# Patient Record
Sex: Female | Born: 1945
Health system: Southern US, Community
[De-identification: ages and names within clinical notes are randomized; demographics above are authoritative.]

## PROBLEM LIST (undated history)

## (undated) DIAGNOSIS — E041 Nontoxic single thyroid nodule: Secondary | ICD-10-CM

## (undated) DIAGNOSIS — E119 Type 2 diabetes mellitus without complications: Secondary | ICD-10-CM

## (undated) DIAGNOSIS — M1990 Unspecified osteoarthritis, unspecified site: Secondary | ICD-10-CM

## (undated) DIAGNOSIS — D649 Anemia, unspecified: Secondary | ICD-10-CM

## (undated) DIAGNOSIS — H269 Unspecified cataract: Secondary | ICD-10-CM

## (undated) DIAGNOSIS — I10 Essential (primary) hypertension: Secondary | ICD-10-CM

## (undated) HISTORY — DX: Essential (primary) hypertension: I10

## (undated) HISTORY — DX: Unspecified cataract: H26.9

## (undated) HISTORY — DX: Nontoxic single thyroid nodule: E04.1

## (undated) HISTORY — DX: Type 2 diabetes mellitus without complications: E11.9

## (undated) HISTORY — DX: Anemia, unspecified: D64.9

## (undated) HISTORY — DX: Unspecified osteoarthritis, unspecified site: M19.90

---

## 1998-10-28 ENCOUNTER — Encounter: Payer: Self-pay | Admitting: Family Medicine

## 1998-10-28 ENCOUNTER — Ambulatory Visit (HOSPITAL_COMMUNITY): Admission: RE | Admit: 1998-10-28 | Discharge: 1998-10-28 | Payer: Self-pay | Admitting: Family Medicine

## 1999-12-10 ENCOUNTER — Encounter: Payer: Self-pay | Admitting: Endocrinology

## 1999-12-10 ENCOUNTER — Ambulatory Visit (HOSPITAL_COMMUNITY): Admission: RE | Admit: 1999-12-10 | Discharge: 1999-12-10 | Payer: Self-pay | Admitting: Endocrinology

## 2000-01-05 ENCOUNTER — Encounter: Payer: Self-pay | Admitting: Endocrinology

## 2000-01-05 ENCOUNTER — Ambulatory Visit (HOSPITAL_COMMUNITY): Admission: RE | Admit: 2000-01-05 | Discharge: 2000-01-05 | Payer: Self-pay | Admitting: Endocrinology

## 2000-01-10 ENCOUNTER — Encounter: Admission: RE | Admit: 2000-01-10 | Discharge: 2000-04-09 | Payer: Self-pay | Admitting: Endocrinology

## 2000-01-24 ENCOUNTER — Encounter: Payer: Self-pay | Admitting: Endocrinology

## 2000-01-24 ENCOUNTER — Ambulatory Visit (HOSPITAL_COMMUNITY): Admission: RE | Admit: 2000-01-24 | Discharge: 2000-01-24 | Payer: Self-pay | Admitting: Endocrinology

## 2000-05-22 ENCOUNTER — Ambulatory Visit (HOSPITAL_COMMUNITY): Admission: RE | Admit: 2000-05-22 | Discharge: 2000-05-22 | Payer: Self-pay | Admitting: *Deleted

## 2000-05-22 ENCOUNTER — Encounter (INDEPENDENT_AMBULATORY_CARE_PROVIDER_SITE_OTHER): Payer: Self-pay | Admitting: Specialist

## 2000-12-28 ENCOUNTER — Ambulatory Visit (HOSPITAL_COMMUNITY): Admission: RE | Admit: 2000-12-28 | Discharge: 2000-12-28 | Payer: Self-pay | Admitting: Family Medicine

## 2000-12-28 ENCOUNTER — Encounter: Payer: Self-pay | Admitting: Family Medicine

## 2003-02-11 ENCOUNTER — Other Ambulatory Visit: Admission: RE | Admit: 2003-02-11 | Discharge: 2003-02-11 | Payer: Self-pay | Admitting: Gynecology

## 2004-08-11 ENCOUNTER — Other Ambulatory Visit: Admission: RE | Admit: 2004-08-11 | Discharge: 2004-08-11 | Payer: Self-pay | Admitting: Gynecology

## 2006-06-20 ENCOUNTER — Encounter: Admission: RE | Admit: 2006-06-20 | Discharge: 2006-06-20 | Payer: Self-pay | Admitting: Family Medicine

## 2007-08-15 ENCOUNTER — Other Ambulatory Visit: Admission: RE | Admit: 2007-08-15 | Discharge: 2007-08-15 | Payer: Self-pay | Admitting: Gynecology

## 2012-10-24 DIAGNOSIS — E782 Mixed hyperlipidemia: Secondary | ICD-10-CM | POA: Diagnosis not present

## 2012-10-24 DIAGNOSIS — Z23 Encounter for immunization: Secondary | ICD-10-CM | POA: Diagnosis not present

## 2012-10-24 DIAGNOSIS — F17200 Nicotine dependence, unspecified, uncomplicated: Secondary | ICD-10-CM | POA: Diagnosis not present

## 2012-10-24 DIAGNOSIS — I10 Essential (primary) hypertension: Secondary | ICD-10-CM | POA: Diagnosis not present

## 2012-10-24 DIAGNOSIS — Z0000 Encounter for general adult medical examination without abnormal findings: Secondary | ICD-10-CM | POA: Diagnosis not present

## 2012-10-24 DIAGNOSIS — Z1211 Encounter for screening for malignant neoplasm of colon: Secondary | ICD-10-CM | POA: Diagnosis not present

## 2012-10-24 DIAGNOSIS — Z780 Asymptomatic menopausal state: Secondary | ICD-10-CM | POA: Diagnosis not present

## 2012-10-24 DIAGNOSIS — E0590 Thyrotoxicosis, unspecified without thyrotoxic crisis or storm: Secondary | ICD-10-CM | POA: Diagnosis not present

## 2012-10-30 DIAGNOSIS — E782 Mixed hyperlipidemia: Secondary | ICD-10-CM | POA: Diagnosis not present

## 2012-10-30 DIAGNOSIS — F17200 Nicotine dependence, unspecified, uncomplicated: Secondary | ICD-10-CM | POA: Diagnosis not present

## 2012-10-30 DIAGNOSIS — I10 Essential (primary) hypertension: Secondary | ICD-10-CM | POA: Diagnosis not present

## 2012-10-30 DIAGNOSIS — Z1331 Encounter for screening for depression: Secondary | ICD-10-CM | POA: Diagnosis not present

## 2013-05-10 DIAGNOSIS — Z1331 Encounter for screening for depression: Secondary | ICD-10-CM | POA: Diagnosis not present

## 2013-05-10 DIAGNOSIS — I10 Essential (primary) hypertension: Secondary | ICD-10-CM | POA: Diagnosis not present

## 2013-05-10 DIAGNOSIS — F17200 Nicotine dependence, unspecified, uncomplicated: Secondary | ICD-10-CM | POA: Diagnosis not present

## 2013-05-10 DIAGNOSIS — E782 Mixed hyperlipidemia: Secondary | ICD-10-CM | POA: Diagnosis not present

## 2013-05-15 DIAGNOSIS — I10 Essential (primary) hypertension: Secondary | ICD-10-CM | POA: Diagnosis not present

## 2013-05-15 DIAGNOSIS — M5430 Sciatica, unspecified side: Secondary | ICD-10-CM | POA: Diagnosis not present

## 2013-05-15 DIAGNOSIS — F17200 Nicotine dependence, unspecified, uncomplicated: Secondary | ICD-10-CM | POA: Diagnosis not present

## 2013-05-15 DIAGNOSIS — E119 Type 2 diabetes mellitus without complications: Secondary | ICD-10-CM | POA: Diagnosis not present

## 2013-05-15 DIAGNOSIS — E782 Mixed hyperlipidemia: Secondary | ICD-10-CM | POA: Diagnosis not present

## 2013-06-11 ENCOUNTER — Ambulatory Visit: Payer: Medicare Other | Admitting: Physical Therapy

## 2013-06-27 ENCOUNTER — Ambulatory Visit: Payer: Medicare Other | Attending: Family Medicine | Admitting: Physical Therapy

## 2013-06-27 DIAGNOSIS — IMO0001 Reserved for inherently not codable concepts without codable children: Secondary | ICD-10-CM | POA: Insufficient documentation

## 2013-06-27 DIAGNOSIS — M545 Low back pain, unspecified: Secondary | ICD-10-CM | POA: Insufficient documentation

## 2013-06-27 DIAGNOSIS — M25569 Pain in unspecified knee: Secondary | ICD-10-CM | POA: Diagnosis not present

## 2013-07-01 ENCOUNTER — Ambulatory Visit: Payer: Medicare Other | Admitting: Physical Therapy

## 2013-07-01 DIAGNOSIS — M25569 Pain in unspecified knee: Secondary | ICD-10-CM | POA: Diagnosis not present

## 2013-07-01 DIAGNOSIS — M545 Low back pain: Secondary | ICD-10-CM | POA: Diagnosis not present

## 2013-07-01 DIAGNOSIS — IMO0001 Reserved for inherently not codable concepts without codable children: Secondary | ICD-10-CM | POA: Diagnosis not present

## 2013-07-04 ENCOUNTER — Ambulatory Visit: Payer: Medicare Other | Admitting: Physical Therapy

## 2013-07-08 ENCOUNTER — Ambulatory Visit: Payer: Medicare Other | Admitting: Physical Therapy

## 2013-07-08 DIAGNOSIS — IMO0001 Reserved for inherently not codable concepts without codable children: Secondary | ICD-10-CM | POA: Diagnosis not present

## 2013-07-08 DIAGNOSIS — M545 Low back pain: Secondary | ICD-10-CM | POA: Diagnosis not present

## 2013-07-08 DIAGNOSIS — M25569 Pain in unspecified knee: Secondary | ICD-10-CM | POA: Diagnosis not present

## 2013-07-10 DIAGNOSIS — M5430 Sciatica, unspecified side: Secondary | ICD-10-CM | POA: Diagnosis not present

## 2013-07-11 ENCOUNTER — Other Ambulatory Visit: Payer: Self-pay | Admitting: Orthopaedic Surgery

## 2013-07-11 ENCOUNTER — Ambulatory Visit: Payer: Medicare Other | Admitting: Physical Therapy

## 2013-07-11 DIAGNOSIS — M545 Low back pain: Secondary | ICD-10-CM

## 2013-07-13 ENCOUNTER — Ambulatory Visit
Admission: RE | Admit: 2013-07-13 | Discharge: 2013-07-13 | Disposition: A | Discharge status: Home / Self Care | Payer: Medicare Other | Source: Ambulatory Visit | Attending: Orthopaedic Surgery | Admitting: Orthopaedic Surgery

## 2013-07-13 DIAGNOSIS — M545 Low back pain, unspecified: Secondary | ICD-10-CM

## 2013-07-13 DIAGNOSIS — M5126 Other intervertebral disc displacement, lumbar region: Secondary | ICD-10-CM | POA: Diagnosis not present

## 2013-07-15 ENCOUNTER — Ambulatory Visit: Payer: Medicare Other | Admitting: Physical Therapy

## 2013-07-18 ENCOUNTER — Ambulatory Visit: Payer: Medicare Other | Admitting: Physical Therapy

## 2013-07-22 DIAGNOSIS — M5126 Other intervertebral disc displacement, lumbar region: Secondary | ICD-10-CM | POA: Diagnosis not present

## 2013-08-06 DIAGNOSIS — IMO0002 Reserved for concepts with insufficient information to code with codable children: Secondary | ICD-10-CM | POA: Diagnosis not present

## 2013-08-06 DIAGNOSIS — M5126 Other intervertebral disc displacement, lumbar region: Secondary | ICD-10-CM | POA: Diagnosis not present

## 2013-08-22 DIAGNOSIS — IMO0002 Reserved for concepts with insufficient information to code with codable children: Secondary | ICD-10-CM | POA: Diagnosis not present

## 2013-08-22 DIAGNOSIS — M5126 Other intervertebral disc displacement, lumbar region: Secondary | ICD-10-CM | POA: Diagnosis not present

## 2013-11-12 DIAGNOSIS — Z1331 Encounter for screening for depression: Secondary | ICD-10-CM | POA: Diagnosis not present

## 2013-11-12 DIAGNOSIS — F17200 Nicotine dependence, unspecified, uncomplicated: Secondary | ICD-10-CM | POA: Diagnosis not present

## 2013-11-12 DIAGNOSIS — E782 Mixed hyperlipidemia: Secondary | ICD-10-CM | POA: Diagnosis not present

## 2013-11-12 DIAGNOSIS — I10 Essential (primary) hypertension: Secondary | ICD-10-CM | POA: Diagnosis not present

## 2013-11-12 DIAGNOSIS — IMO0001 Reserved for inherently not codable concepts without codable children: Secondary | ICD-10-CM | POA: Diagnosis not present

## 2013-11-15 DIAGNOSIS — M5430 Sciatica, unspecified side: Secondary | ICD-10-CM | POA: Diagnosis not present

## 2013-11-15 DIAGNOSIS — E119 Type 2 diabetes mellitus without complications: Secondary | ICD-10-CM | POA: Diagnosis not present

## 2013-11-15 DIAGNOSIS — F17200 Nicotine dependence, unspecified, uncomplicated: Secondary | ICD-10-CM | POA: Diagnosis not present

## 2013-11-15 DIAGNOSIS — E782 Mixed hyperlipidemia: Secondary | ICD-10-CM | POA: Diagnosis not present

## 2013-11-15 DIAGNOSIS — I10 Essential (primary) hypertension: Secondary | ICD-10-CM | POA: Diagnosis not present

## 2014-07-15 DIAGNOSIS — E0590 Thyrotoxicosis, unspecified without thyrotoxic crisis or storm: Secondary | ICD-10-CM | POA: Diagnosis not present

## 2014-07-15 DIAGNOSIS — I10 Essential (primary) hypertension: Secondary | ICD-10-CM | POA: Diagnosis not present

## 2014-07-15 DIAGNOSIS — E782 Mixed hyperlipidemia: Secondary | ICD-10-CM | POA: Diagnosis not present

## 2014-07-15 DIAGNOSIS — E119 Type 2 diabetes mellitus without complications: Secondary | ICD-10-CM | POA: Diagnosis not present

## 2014-07-15 DIAGNOSIS — F17210 Nicotine dependence, cigarettes, uncomplicated: Secondary | ICD-10-CM | POA: Diagnosis not present

## 2014-07-15 DIAGNOSIS — E1165 Type 2 diabetes mellitus with hyperglycemia: Secondary | ICD-10-CM | POA: Diagnosis not present

## 2014-07-15 DIAGNOSIS — N3941 Urge incontinence: Secondary | ICD-10-CM | POA: Diagnosis not present

## 2014-07-18 DIAGNOSIS — F17210 Nicotine dependence, cigarettes, uncomplicated: Secondary | ICD-10-CM | POA: Diagnosis not present

## 2014-07-18 DIAGNOSIS — E782 Mixed hyperlipidemia: Secondary | ICD-10-CM | POA: Diagnosis not present

## 2014-07-18 DIAGNOSIS — Z23 Encounter for immunization: Secondary | ICD-10-CM | POA: Diagnosis not present

## 2014-07-18 DIAGNOSIS — I10 Essential (primary) hypertension: Secondary | ICD-10-CM | POA: Diagnosis not present

## 2014-07-18 DIAGNOSIS — E1165 Type 2 diabetes mellitus with hyperglycemia: Secondary | ICD-10-CM | POA: Diagnosis not present

## 2015-01-14 DIAGNOSIS — F17210 Nicotine dependence, cigarettes, uncomplicated: Secondary | ICD-10-CM | POA: Diagnosis not present

## 2015-01-14 DIAGNOSIS — E1165 Type 2 diabetes mellitus with hyperglycemia: Secondary | ICD-10-CM | POA: Diagnosis not present

## 2015-01-14 DIAGNOSIS — Z23 Encounter for immunization: Secondary | ICD-10-CM | POA: Diagnosis not present

## 2015-01-14 DIAGNOSIS — E782 Mixed hyperlipidemia: Secondary | ICD-10-CM | POA: Diagnosis not present

## 2015-01-14 DIAGNOSIS — I10 Essential (primary) hypertension: Secondary | ICD-10-CM | POA: Diagnosis not present

## 2015-01-19 DIAGNOSIS — G479 Sleep disorder, unspecified: Secondary | ICD-10-CM | POA: Diagnosis not present

## 2015-01-19 DIAGNOSIS — Z23 Encounter for immunization: Secondary | ICD-10-CM | POA: Diagnosis not present

## 2015-01-19 DIAGNOSIS — E1165 Type 2 diabetes mellitus with hyperglycemia: Secondary | ICD-10-CM | POA: Diagnosis not present

## 2015-01-19 DIAGNOSIS — I10 Essential (primary) hypertension: Secondary | ICD-10-CM | POA: Diagnosis not present

## 2015-01-19 DIAGNOSIS — E782 Mixed hyperlipidemia: Secondary | ICD-10-CM | POA: Diagnosis not present

## 2015-01-19 DIAGNOSIS — F17210 Nicotine dependence, cigarettes, uncomplicated: Secondary | ICD-10-CM | POA: Diagnosis not present

## 2015-01-19 DIAGNOSIS — R42 Dizziness and giddiness: Secondary | ICD-10-CM | POA: Diagnosis not present

## 2015-02-17 DIAGNOSIS — I10 Essential (primary) hypertension: Secondary | ICD-10-CM | POA: Diagnosis not present

## 2015-02-17 DIAGNOSIS — Z1239 Encounter for other screening for malignant neoplasm of breast: Secondary | ICD-10-CM | POA: Diagnosis not present

## 2015-02-17 DIAGNOSIS — E782 Mixed hyperlipidemia: Secondary | ICD-10-CM | POA: Diagnosis not present

## 2015-02-17 DIAGNOSIS — Z1211 Encounter for screening for malignant neoplasm of colon: Secondary | ICD-10-CM | POA: Diagnosis not present

## 2015-03-18 ENCOUNTER — Ambulatory Visit
Admission: RE | Admit: 2015-03-18 | Discharge: 2015-03-18 | Disposition: A | Discharge status: Home / Self Care | Payer: Medicare Other | Source: Ambulatory Visit | Attending: Family Medicine | Admitting: Family Medicine

## 2015-03-18 ENCOUNTER — Other Ambulatory Visit: Payer: Self-pay | Admitting: Family Medicine

## 2015-03-18 DIAGNOSIS — R0781 Pleurodynia: Secondary | ICD-10-CM | POA: Diagnosis not present

## 2015-03-18 DIAGNOSIS — S2231XA Fracture of one rib, right side, initial encounter for closed fracture: Secondary | ICD-10-CM | POA: Diagnosis not present

## 2016-02-01 DIAGNOSIS — Z1239 Encounter for other screening for malignant neoplasm of breast: Secondary | ICD-10-CM | POA: Diagnosis not present

## 2016-02-01 DIAGNOSIS — E782 Mixed hyperlipidemia: Secondary | ICD-10-CM | POA: Diagnosis not present

## 2016-02-01 DIAGNOSIS — F17210 Nicotine dependence, cigarettes, uncomplicated: Secondary | ICD-10-CM | POA: Diagnosis not present

## 2016-02-01 DIAGNOSIS — Z1211 Encounter for screening for malignant neoplasm of colon: Secondary | ICD-10-CM | POA: Diagnosis not present

## 2016-02-01 DIAGNOSIS — E1165 Type 2 diabetes mellitus with hyperglycemia: Secondary | ICD-10-CM | POA: Diagnosis not present

## 2016-02-01 DIAGNOSIS — Z23 Encounter for immunization: Secondary | ICD-10-CM | POA: Diagnosis not present

## 2016-02-01 DIAGNOSIS — I10 Essential (primary) hypertension: Secondary | ICD-10-CM | POA: Diagnosis not present

## 2016-02-01 DIAGNOSIS — R42 Dizziness and giddiness: Secondary | ICD-10-CM | POA: Diagnosis not present

## 2016-02-01 DIAGNOSIS — E0590 Thyrotoxicosis, unspecified without thyrotoxic crisis or storm: Secondary | ICD-10-CM | POA: Diagnosis not present

## 2016-02-01 DIAGNOSIS — G479 Sleep disorder, unspecified: Secondary | ICD-10-CM | POA: Diagnosis not present

## 2016-02-04 DIAGNOSIS — Z794 Long term (current) use of insulin: Secondary | ICD-10-CM | POA: Diagnosis not present

## 2016-02-04 DIAGNOSIS — E782 Mixed hyperlipidemia: Secondary | ICD-10-CM | POA: Diagnosis not present

## 2016-02-04 DIAGNOSIS — Z7984 Long term (current) use of oral hypoglycemic drugs: Secondary | ICD-10-CM | POA: Diagnosis not present

## 2016-02-04 DIAGNOSIS — E1165 Type 2 diabetes mellitus with hyperglycemia: Secondary | ICD-10-CM | POA: Diagnosis not present

## 2016-02-04 DIAGNOSIS — I10 Essential (primary) hypertension: Secondary | ICD-10-CM | POA: Diagnosis not present

## 2016-08-09 DIAGNOSIS — Z794 Long term (current) use of insulin: Secondary | ICD-10-CM | POA: Diagnosis not present

## 2016-08-09 DIAGNOSIS — E119 Type 2 diabetes mellitus without complications: Secondary | ICD-10-CM | POA: Diagnosis not present

## 2016-08-11 DIAGNOSIS — Z1389 Encounter for screening for other disorder: Secondary | ICD-10-CM | POA: Diagnosis not present

## 2016-08-11 DIAGNOSIS — Z0000 Encounter for general adult medical examination without abnormal findings: Secondary | ICD-10-CM | POA: Diagnosis not present

## 2016-08-11 DIAGNOSIS — Z794 Long term (current) use of insulin: Secondary | ICD-10-CM | POA: Diagnosis not present

## 2016-08-11 DIAGNOSIS — Z7189 Other specified counseling: Secondary | ICD-10-CM | POA: Diagnosis not present

## 2016-08-11 DIAGNOSIS — I10 Essential (primary) hypertension: Secondary | ICD-10-CM | POA: Diagnosis not present

## 2016-08-11 DIAGNOSIS — Z01419 Encounter for gynecological examination (general) (routine) without abnormal findings: Secondary | ICD-10-CM | POA: Diagnosis not present

## 2016-08-11 DIAGNOSIS — Z1231 Encounter for screening mammogram for malignant neoplasm of breast: Secondary | ICD-10-CM | POA: Diagnosis not present

## 2016-08-11 DIAGNOSIS — N959 Unspecified menopausal and perimenopausal disorder: Secondary | ICD-10-CM | POA: Diagnosis not present

## 2016-08-11 DIAGNOSIS — E782 Mixed hyperlipidemia: Secondary | ICD-10-CM | POA: Diagnosis not present

## 2016-08-11 DIAGNOSIS — Z1211 Encounter for screening for malignant neoplasm of colon: Secondary | ICD-10-CM | POA: Diagnosis not present

## 2016-08-11 DIAGNOSIS — F17210 Nicotine dependence, cigarettes, uncomplicated: Secondary | ICD-10-CM | POA: Diagnosis not present

## 2016-08-11 DIAGNOSIS — E119 Type 2 diabetes mellitus without complications: Secondary | ICD-10-CM | POA: Diagnosis not present

## 2016-08-17 DIAGNOSIS — Z1231 Encounter for screening mammogram for malignant neoplasm of breast: Secondary | ICD-10-CM | POA: Diagnosis not present

## 2016-09-27 DIAGNOSIS — M8588 Other specified disorders of bone density and structure, other site: Secondary | ICD-10-CM | POA: Diagnosis not present

## 2016-09-27 DIAGNOSIS — Z780 Asymptomatic menopausal state: Secondary | ICD-10-CM | POA: Diagnosis not present

## 2017-02-01 DIAGNOSIS — E119 Type 2 diabetes mellitus without complications: Secondary | ICD-10-CM | POA: Diagnosis not present

## 2017-02-01 DIAGNOSIS — Z1231 Encounter for screening mammogram for malignant neoplasm of breast: Secondary | ICD-10-CM | POA: Diagnosis not present

## 2017-02-01 DIAGNOSIS — Z1211 Encounter for screening for malignant neoplasm of colon: Secondary | ICD-10-CM | POA: Diagnosis not present

## 2017-02-01 DIAGNOSIS — Z794 Long term (current) use of insulin: Secondary | ICD-10-CM | POA: Diagnosis not present

## 2017-02-01 DIAGNOSIS — Z0000 Encounter for general adult medical examination without abnormal findings: Secondary | ICD-10-CM | POA: Diagnosis not present

## 2017-02-01 DIAGNOSIS — E782 Mixed hyperlipidemia: Secondary | ICD-10-CM | POA: Diagnosis not present

## 2017-02-01 DIAGNOSIS — Z7189 Other specified counseling: Secondary | ICD-10-CM | POA: Diagnosis not present

## 2017-02-01 DIAGNOSIS — F17210 Nicotine dependence, cigarettes, uncomplicated: Secondary | ICD-10-CM | POA: Diagnosis not present

## 2017-02-01 DIAGNOSIS — M85852 Other specified disorders of bone density and structure, left thigh: Secondary | ICD-10-CM | POA: Diagnosis not present

## 2017-02-01 DIAGNOSIS — N959 Unspecified menopausal and perimenopausal disorder: Secondary | ICD-10-CM | POA: Diagnosis not present

## 2017-02-01 DIAGNOSIS — I10 Essential (primary) hypertension: Secondary | ICD-10-CM | POA: Diagnosis not present

## 2017-02-07 DIAGNOSIS — Z794 Long term (current) use of insulin: Secondary | ICD-10-CM | POA: Diagnosis not present

## 2017-02-07 DIAGNOSIS — Z1211 Encounter for screening for malignant neoplasm of colon: Secondary | ICD-10-CM | POA: Diagnosis not present

## 2017-02-07 DIAGNOSIS — M85852 Other specified disorders of bone density and structure, left thigh: Secondary | ICD-10-CM | POA: Diagnosis not present

## 2017-02-07 DIAGNOSIS — Z7984 Long term (current) use of oral hypoglycemic drugs: Secondary | ICD-10-CM | POA: Diagnosis not present

## 2017-02-07 DIAGNOSIS — F17210 Nicotine dependence, cigarettes, uncomplicated: Secondary | ICD-10-CM | POA: Diagnosis not present

## 2017-02-07 DIAGNOSIS — E782 Mixed hyperlipidemia: Secondary | ICD-10-CM | POA: Diagnosis not present

## 2017-02-07 DIAGNOSIS — I10 Essential (primary) hypertension: Secondary | ICD-10-CM | POA: Diagnosis not present

## 2017-02-07 DIAGNOSIS — E119 Type 2 diabetes mellitus without complications: Secondary | ICD-10-CM | POA: Diagnosis not present

## 2017-12-18 DIAGNOSIS — Z7984 Long term (current) use of oral hypoglycemic drugs: Secondary | ICD-10-CM | POA: Diagnosis not present

## 2017-12-18 DIAGNOSIS — E782 Mixed hyperlipidemia: Secondary | ICD-10-CM | POA: Diagnosis not present

## 2017-12-18 DIAGNOSIS — E119 Type 2 diabetes mellitus without complications: Secondary | ICD-10-CM | POA: Diagnosis not present

## 2017-12-18 DIAGNOSIS — I10 Essential (primary) hypertension: Secondary | ICD-10-CM | POA: Diagnosis not present

## 2017-12-18 DIAGNOSIS — M85852 Other specified disorders of bone density and structure, left thigh: Secondary | ICD-10-CM | POA: Diagnosis not present

## 2017-12-18 DIAGNOSIS — F17210 Nicotine dependence, cigarettes, uncomplicated: Secondary | ICD-10-CM | POA: Diagnosis not present

## 2017-12-21 DIAGNOSIS — E1165 Type 2 diabetes mellitus with hyperglycemia: Secondary | ICD-10-CM | POA: Diagnosis not present

## 2017-12-21 DIAGNOSIS — I10 Essential (primary) hypertension: Secondary | ICD-10-CM | POA: Diagnosis not present

## 2017-12-21 DIAGNOSIS — Z7189 Other specified counseling: Secondary | ICD-10-CM | POA: Diagnosis not present

## 2017-12-21 DIAGNOSIS — F17210 Nicotine dependence, cigarettes, uncomplicated: Secondary | ICD-10-CM | POA: Diagnosis not present

## 2017-12-21 DIAGNOSIS — Z1389 Encounter for screening for other disorder: Secondary | ICD-10-CM | POA: Diagnosis not present

## 2017-12-21 DIAGNOSIS — Z7984 Long term (current) use of oral hypoglycemic drugs: Secondary | ICD-10-CM | POA: Diagnosis not present

## 2017-12-21 DIAGNOSIS — E782 Mixed hyperlipidemia: Secondary | ICD-10-CM | POA: Diagnosis not present

## 2017-12-21 DIAGNOSIS — M85852 Other specified disorders of bone density and structure, left thigh: Secondary | ICD-10-CM | POA: Diagnosis not present

## 2017-12-21 DIAGNOSIS — Z0000 Encounter for general adult medical examination without abnormal findings: Secondary | ICD-10-CM | POA: Diagnosis not present

## 2018-03-23 DIAGNOSIS — M85852 Other specified disorders of bone density and structure, left thigh: Secondary | ICD-10-CM | POA: Diagnosis not present

## 2018-03-23 DIAGNOSIS — E119 Type 2 diabetes mellitus without complications: Secondary | ICD-10-CM | POA: Diagnosis not present

## 2018-03-23 DIAGNOSIS — E782 Mixed hyperlipidemia: Secondary | ICD-10-CM | POA: Diagnosis not present

## 2018-03-30 DIAGNOSIS — M85852 Other specified disorders of bone density and structure, left thigh: Secondary | ICD-10-CM | POA: Diagnosis not present

## 2018-03-30 DIAGNOSIS — I10 Essential (primary) hypertension: Secondary | ICD-10-CM | POA: Diagnosis not present

## 2018-03-30 DIAGNOSIS — E1165 Type 2 diabetes mellitus with hyperglycemia: Secondary | ICD-10-CM | POA: Diagnosis not present

## 2018-03-30 DIAGNOSIS — E782 Mixed hyperlipidemia: Secondary | ICD-10-CM | POA: Diagnosis not present

## 2019-04-09 DIAGNOSIS — M85852 Other specified disorders of bone density and structure, left thigh: Secondary | ICD-10-CM | POA: Diagnosis not present

## 2019-04-09 DIAGNOSIS — E782 Mixed hyperlipidemia: Secondary | ICD-10-CM | POA: Diagnosis not present

## 2019-04-09 DIAGNOSIS — E1165 Type 2 diabetes mellitus with hyperglycemia: Secondary | ICD-10-CM | POA: Diagnosis not present

## 2019-04-16 DIAGNOSIS — M85852 Other specified disorders of bone density and structure, left thigh: Secondary | ICD-10-CM | POA: Diagnosis not present

## 2019-04-16 DIAGNOSIS — I739 Peripheral vascular disease, unspecified: Secondary | ICD-10-CM | POA: Diagnosis not present

## 2019-04-16 DIAGNOSIS — Z1211 Encounter for screening for malignant neoplasm of colon: Secondary | ICD-10-CM | POA: Diagnosis not present

## 2019-04-16 DIAGNOSIS — Z0000 Encounter for general adult medical examination without abnormal findings: Secondary | ICD-10-CM | POA: Diagnosis not present

## 2019-04-16 DIAGNOSIS — Z1231 Encounter for screening mammogram for malignant neoplasm of breast: Secondary | ICD-10-CM | POA: Diagnosis not present

## 2019-04-16 DIAGNOSIS — I10 Essential (primary) hypertension: Secondary | ICD-10-CM | POA: Diagnosis not present

## 2019-04-16 DIAGNOSIS — Z1389 Encounter for screening for other disorder: Secondary | ICD-10-CM | POA: Diagnosis not present

## 2019-04-16 DIAGNOSIS — E1165 Type 2 diabetes mellitus with hyperglycemia: Secondary | ICD-10-CM | POA: Diagnosis not present

## 2019-04-16 DIAGNOSIS — E782 Mixed hyperlipidemia: Secondary | ICD-10-CM | POA: Diagnosis not present

## 2019-04-17 ENCOUNTER — Encounter: Payer: Self-pay | Admitting: Gastroenterology

## 2019-04-18 ENCOUNTER — Other Ambulatory Visit: Payer: Self-pay | Admitting: Family Medicine

## 2019-04-18 DIAGNOSIS — I739 Peripheral vascular disease, unspecified: Secondary | ICD-10-CM

## 2019-04-23 DIAGNOSIS — E782 Mixed hyperlipidemia: Secondary | ICD-10-CM | POA: Diagnosis not present

## 2019-04-23 DIAGNOSIS — I10 Essential (primary) hypertension: Secondary | ICD-10-CM | POA: Diagnosis not present

## 2019-04-23 DIAGNOSIS — E1165 Type 2 diabetes mellitus with hyperglycemia: Secondary | ICD-10-CM | POA: Diagnosis not present

## 2019-04-23 DIAGNOSIS — E559 Vitamin D deficiency, unspecified: Secondary | ICD-10-CM | POA: Diagnosis not present

## 2019-05-01 ENCOUNTER — Ambulatory Visit (AMBULATORY_SURGERY_CENTER): Payer: Self-pay | Admitting: *Deleted

## 2019-05-01 ENCOUNTER — Other Ambulatory Visit: Payer: Self-pay

## 2019-05-01 VITALS — Temp 96.6°F | Ht 66.0 in | Wt 142.4 lb

## 2019-05-01 DIAGNOSIS — Z1211 Encounter for screening for malignant neoplasm of colon: Secondary | ICD-10-CM

## 2019-05-01 MED ORDER — NA SULFATE-K SULFATE-MG SULF 17.5-3.13-1.6 GM/177ML PO SOLN: 1.0000 | Freq: Once | ORAL | 0 refills | Status: AC

## 2019-05-01 NOTE — Progress Notes (Signed)
No egg or soy allergy known to patient  No issues with past sedation with any surgeries  or procedures, no intubation problems  No diet pills per patient No home 02 use per patient  No blood thinners per patient  Pt denies issues with constipation  No A fib or A flutter  EMMI video sent to pt's e mail  

## 2019-05-06 DIAGNOSIS — Z1231 Encounter for screening mammogram for malignant neoplasm of breast: Secondary | ICD-10-CM | POA: Diagnosis not present

## 2019-05-06 DIAGNOSIS — M8589 Other specified disorders of bone density and structure, multiple sites: Secondary | ICD-10-CM | POA: Diagnosis not present

## 2019-05-06 DIAGNOSIS — Z90710 Acquired absence of both cervix and uterus: Secondary | ICD-10-CM | POA: Diagnosis not present

## 2019-05-08 ENCOUNTER — Encounter: Payer: Self-pay | Admitting: Gastroenterology

## 2019-05-14 ENCOUNTER — Telehealth: Payer: Self-pay

## 2019-05-14 NOTE — Telephone Encounter (Signed)
Covid-19 screening questions   Do you now or have you had a fever in the last 14 days?  Do you have any respiratory symptoms of shortness of breath or cough now or in the last 14 days?  Do you have any family members or close contacts with diagnosed or suspected Covid-19 in the past 14 days?  Have you been tested for Covid-19 and found to be positive?       

## 2019-05-15 ENCOUNTER — Other Ambulatory Visit: Payer: Self-pay

## 2019-05-15 ENCOUNTER — Encounter: Payer: Self-pay | Admitting: Gastroenterology

## 2019-05-15 ENCOUNTER — Ambulatory Visit (AMBULATORY_SURGERY_CENTER): Payer: Medicare Other | Admitting: Gastroenterology

## 2019-05-15 VITALS — BP 132/69 | HR 85 | Temp 97.9°F | Resp 14 | Ht 66.0 in | Wt 142.0 lb

## 2019-05-15 DIAGNOSIS — D122 Benign neoplasm of ascending colon: Secondary | ICD-10-CM | POA: Diagnosis not present

## 2019-05-15 DIAGNOSIS — D123 Benign neoplasm of transverse colon: Secondary | ICD-10-CM

## 2019-05-15 DIAGNOSIS — Z1211 Encounter for screening for malignant neoplasm of colon: Secondary | ICD-10-CM

## 2019-05-15 DIAGNOSIS — D124 Benign neoplasm of descending colon: Secondary | ICD-10-CM

## 2019-05-15 MED ORDER — SODIUM CHLORIDE 0.9 % IV SOLN: 500.0000 mL | Freq: Once | INTRAVENOUS | Status: DC

## 2019-05-15 NOTE — Progress Notes (Signed)
Pt's states no medical or surgical changes since previsit or office visit. 

## 2019-05-15 NOTE — Progress Notes (Signed)
Temperature taken by S.P., VS taken by C.W.

## 2019-05-15 NOTE — Op Note (Signed)
[facility] Patient Name: Elizabeth Gill Procedure Date: 05/15/2019 11:23 AM MRN: [id number] Endoscopist: Mauri Pole , MD Age: 73 Referring MD:  Date of Birth: September 26, 1945 Gender: Female Account #: [id number] Procedure:                Colonoscopy Indications:              Screening for colorectal malignant neoplasm Medicines:                Monitored Anesthesia Care Procedure:                Pre-Anesthesia Assessment:                           - Prior to the procedure, a History and Physical                            was performed, and patient medications and                            allergies were reviewed. The patient's tolerance of                            previous anesthesia was also reviewed. The risks                            and benefits of the procedure and the sedation                            options and risks were discussed with the patient.                            All questions were answered, and informed consent                            was obtained. Prior Anticoagulants: The patient has                            taken no previous anticoagulant or antiplatelet                            agents. ASA Grade Assessment: II - A patient with                            mild systemic disease. After reviewing the risks                            and benefits, the patient was deemed in                            satisfactory condition to undergo the procedure.                           After obtaining informed consent, the colonoscope  was passed under direct vision. Throughout the                            procedure, the patient's blood pressure, pulse, and                            oxygen saturations were monitored continuously. The                            Colonoscope was introduced through the anus and                            advanced to the the cecum, identified by                            appendiceal orifice  and ileocecal valve. The                            colonoscopy was performed without difficulty. The                            patient tolerated the procedure well. The quality                            of the bowel preparation was excellent. The                            ileocecal valve, appendiceal orifice, and rectum                            were photographed. Scope In: 11:28:54 AM Scope Out: 11:51:16 AM Scope Withdrawal Time: 0 hours 16 minutes 57 seconds  Total Procedure Duration: 0 hours 22 minutes 22 seconds  Findings:                 The perianal and digital rectal examinations were                            normal.                           Two sessile polyps were found in the transverse                            colon and ascending colon. The polyps were 1 to 2                            mm in size. These polyps were removed with a cold                            biopsy forceps. Resection and retrieval were                            complete.  Three sessile polyps were found in the descending                            colon and transverse colon. The polyps were 4 to 7                            mm in size. These polyps were removed with a cold                            snare. Resection and retrieval were complete.                           Scattered small-mouthed diverticula were found in                            the sigmoid colon, descending colon, transverse                            colon and ascending colon.                           Non-bleeding internal hemorrhoids were found during                            retroflexion. The hemorrhoids were small. Complications:            No immediate complications. Estimated Blood Loss:     Estimated blood loss was minimal. Impression:               - Two 1 to 2 mm polyps in the transverse colon and                            in the ascending colon, removed with a cold biopsy                             forceps. Resected and retrieved.                           - Three 4 to 7 mm polyps in the descending colon                            and in the transverse colon, removed with a cold                            snare. Resected and retrieved.                           - Mild diverticulosis in the sigmoid colon, in the                            descending colon, in the transverse colon and in                            the ascending  colon.                           - Non-bleeding internal hemorrhoids. Recommendation:           - Patient has a contact number available for                            emergencies. The signs and symptoms of potential                            delayed complications were discussed with the                            patient. Return to normal activities tomorrow.                            Written discharge instructions were provided to the                            patient.                           - Resume previous diet.                           - Continue present medications.                           - Await pathology results.                           - Repeat colonoscopy in 3 - 5 years for                            surveillance based on pathology results. Mauri Pole, MD 05/15/2019 11:58:01 AM This report has been signed electronically.

## 2019-05-15 NOTE — Progress Notes (Signed)
PT taken to PACU. Monitors in place. VSS. Report given to RN. 

## 2019-05-15 NOTE — Patient Instructions (Signed)
Please read handouts provided. Continue present medications. Await pathology results. Take one tablespoon of Benefiber three times daily with meals.       YOU HAD AN ENDOSCOPIC PROCEDURE TODAY AT [facility]:   Refer to the procedure report that was given to you for any specific questions about what was found during the examination.  If the procedure report does not answer your questions, please call your gastroenterologist to clarify.  If you requested that your care partner not be given the details of your procedure findings, then the procedure report has been included in a sealed envelope for you to review at your convenience later.  YOU SHOULD EXPECT: Some feelings of bloating in the abdomen. Passage of more gas than usual.  Walking can help get rid of the air that was put into your GI tract during the procedure and reduce the bloating. If you had a lower endoscopy (such as a colonoscopy or flexible sigmoidoscopy) you may notice spotting of blood in your stool or on the toilet paper. If you underwent a bowel prep for your procedure, you may not have a normal bowel movement for a few days.  Please Note:  You might notice some irritation and congestion in your nose or some drainage.  This is from the oxygen used during your procedure.  There is no need for concern and it should clear up in a day or so.  SYMPTOMS TO REPORT IMMEDIATELY:   Following lower endoscopy (colonoscopy or flexible sigmoidoscopy):  Excessive amounts of blood in the stool  Significant tenderness or worsening of abdominal pains  Swelling of the abdomen that is new, acute  Fever of 100F or higher    For urgent or emergent issues, a gastroenterologist can be reached at any hour by calling [phone].   DIET:  We do recommend a small meal at first, but then you may proceed to your regular diet.  Drink plenty of fluids but you should avoid alcoholic beverages for 24 hours.  ACTIVITY:  You should  plan to take it easy for the rest of today and you should NOT DRIVE or use heavy machinery until tomorrow (because of the sedation medicines used during the test).    FOLLOW UP: Our staff will call the number listed on your records 48-72 hours following your procedure to check on you and address any questions or concerns that you may have regarding the information given to you following your procedure. If we do not reach you, we will leave a message.  We will attempt to reach you two times.  During this call, we will ask if you have developed any symptoms of COVID 19. If you develop any symptoms (ie: fever, flu-like symptoms, shortness of breath, cough etc.) before then, please call [phone].  If you test positive for Covid 19 in the 2 weeks post procedure, please call and report this information to [place].    If any biopsies were taken you will be contacted by phone or by letter within the next 1-3 weeks.  Please call us at [phone] if you have not heard about the biopsies in 3 weeks.    SIGNATURES/CONFIDENTIALITY: You and/or your care partner have signed paperwork which will be entered into your electronic medical record.  These signatures attest to the fact that that the information above on your After Visit Summary has been reviewed and is understood.  Full responsibility of the confidentiality of this discharge information lies with you and/or your care-partner.

## 2019-05-15 NOTE — Progress Notes (Signed)
Called to room to assist during endoscopic procedure.  Patient ID and intended procedure confirmed with present staff. Received instructions for my participation in the procedure from the performing physician.  

## 2019-05-17 ENCOUNTER — Telehealth: Payer: Self-pay

## 2019-05-17 ENCOUNTER — Telehealth: Payer: Self-pay | Admitting: *Deleted

## 2019-05-17 NOTE — Telephone Encounter (Signed)
  Follow up Call-  Call back number 05/15/2019  Post procedure Call Back phone  # [phone]  Permission to leave phone message Yes  Some recent data might be hidden     Patient questions:  Do you have a fever, pain , or abdominal swelling? No. Pain Score  0 *  Have you tolerated food without any problems? Yes.    Have you been able to return to your normal activities? Yes.    Do you have any questions about your discharge instructions: Diet   No. Medications  No. Follow up visit  No.  Do you have questions or concerns about your Care? No.  Actions: * If pain score is 4 or above: No action needed, pain <4. 1. Have you developed a fever since your procedure? no  2.   Have you had an respiratory symptoms (SOB or cough) since your procedure? no  3.   Have you tested positive for COVID 19 since your procedure no  4.   Have you had any family members/close contacts diagnosed with the COVID 19 since your procedure?  no   If yes to any of these questions please route to Joylene John, RN and Alphonsa Gin, Therapist, sports.

## 2019-05-17 NOTE — Telephone Encounter (Signed)
  Follow up Call-  Call back number 05/15/2019  Post procedure Call Back phone  # [phone]  Permission to leave phone message Yes  Some recent data might be hidden     Patient questions:  Message left to call us if necessary.

## 2019-05-27 ENCOUNTER — Encounter: Payer: Self-pay | Admitting: Gastroenterology

## 2019-05-29 ENCOUNTER — Encounter: Payer: Self-pay | Admitting: Family Medicine

## 2019-08-13 ENCOUNTER — Ambulatory Visit
Admission: RE | Admit: 2019-08-13 | Discharge: 2019-08-13 | Disposition: A | Discharge status: Home / Self Care | Payer: Medicare Other | Source: Ambulatory Visit | Attending: Family Medicine | Admitting: Family Medicine

## 2019-08-13 DIAGNOSIS — M79662 Pain in left lower leg: Secondary | ICD-10-CM | POA: Diagnosis not present

## 2019-08-13 DIAGNOSIS — M79661 Pain in right lower leg: Secondary | ICD-10-CM | POA: Diagnosis not present

## 2019-08-13 DIAGNOSIS — I739 Peripheral vascular disease, unspecified: Secondary | ICD-10-CM

## 2020-07-22 DIAGNOSIS — Z0000 Encounter for general adult medical examination without abnormal findings: Secondary | ICD-10-CM | POA: Diagnosis not present

## 2020-07-22 DIAGNOSIS — Z1389 Encounter for screening for other disorder: Secondary | ICD-10-CM | POA: Diagnosis not present

## 2020-10-12 DIAGNOSIS — Z23 Encounter for immunization: Secondary | ICD-10-CM | POA: Diagnosis not present

## 2020-10-16 DIAGNOSIS — R7989 Other specified abnormal findings of blood chemistry: Secondary | ICD-10-CM | POA: Diagnosis not present

## 2020-10-16 DIAGNOSIS — M85852 Other specified disorders of bone density and structure, left thigh: Secondary | ICD-10-CM | POA: Diagnosis not present

## 2020-10-16 DIAGNOSIS — I739 Peripheral vascular disease, unspecified: Secondary | ICD-10-CM | POA: Diagnosis not present

## 2020-10-16 DIAGNOSIS — E1165 Type 2 diabetes mellitus with hyperglycemia: Secondary | ICD-10-CM | POA: Diagnosis not present

## 2020-10-16 DIAGNOSIS — I10 Essential (primary) hypertension: Secondary | ICD-10-CM | POA: Diagnosis not present

## 2020-10-16 DIAGNOSIS — N3941 Urge incontinence: Secondary | ICD-10-CM | POA: Diagnosis not present

## 2020-10-16 DIAGNOSIS — Z87891 Personal history of nicotine dependence: Secondary | ICD-10-CM | POA: Diagnosis not present

## 2020-10-16 DIAGNOSIS — Z7984 Long term (current) use of oral hypoglycemic drugs: Secondary | ICD-10-CM | POA: Diagnosis not present

## 2020-10-16 DIAGNOSIS — E782 Mixed hyperlipidemia: Secondary | ICD-10-CM | POA: Diagnosis not present

## 2020-11-02 DIAGNOSIS — E1165 Type 2 diabetes mellitus with hyperglycemia: Secondary | ICD-10-CM | POA: Diagnosis not present

## 2020-11-02 DIAGNOSIS — E782 Mixed hyperlipidemia: Secondary | ICD-10-CM | POA: Diagnosis not present

## 2020-11-02 DIAGNOSIS — I10 Essential (primary) hypertension: Secondary | ICD-10-CM | POA: Diagnosis not present

## 2020-11-02 DIAGNOSIS — Z794 Long term (current) use of insulin: Secondary | ICD-10-CM | POA: Diagnosis not present

## 2021-01-29 DIAGNOSIS — E782 Mixed hyperlipidemia: Secondary | ICD-10-CM | POA: Diagnosis not present

## 2021-01-29 DIAGNOSIS — E1165 Type 2 diabetes mellitus with hyperglycemia: Secondary | ICD-10-CM | POA: Diagnosis not present

## 2021-01-29 DIAGNOSIS — Z7984 Long term (current) use of oral hypoglycemic drugs: Secondary | ICD-10-CM | POA: Diagnosis not present

## 2021-01-29 DIAGNOSIS — R5383 Other fatigue: Secondary | ICD-10-CM | POA: Diagnosis not present

## 2021-01-29 DIAGNOSIS — Z79899 Other long term (current) drug therapy: Secondary | ICD-10-CM | POA: Diagnosis not present

## 2021-01-29 DIAGNOSIS — I10 Essential (primary) hypertension: Secondary | ICD-10-CM | POA: Diagnosis not present

## 2021-02-04 DIAGNOSIS — Z122 Encounter for screening for malignant neoplasm of respiratory organs: Secondary | ICD-10-CM | POA: Diagnosis not present

## 2021-02-04 DIAGNOSIS — Z7984 Long term (current) use of oral hypoglycemic drugs: Secondary | ICD-10-CM | POA: Diagnosis not present

## 2021-02-04 DIAGNOSIS — R718 Other abnormality of red blood cells: Secondary | ICD-10-CM | POA: Diagnosis not present

## 2021-02-04 DIAGNOSIS — E782 Mixed hyperlipidemia: Secondary | ICD-10-CM | POA: Diagnosis not present

## 2021-02-04 DIAGNOSIS — E1165 Type 2 diabetes mellitus with hyperglycemia: Secondary | ICD-10-CM | POA: Diagnosis not present

## 2021-02-04 DIAGNOSIS — Z23 Encounter for immunization: Secondary | ICD-10-CM | POA: Diagnosis not present

## 2021-02-04 DIAGNOSIS — M85852 Other specified disorders of bone density and structure, left thigh: Secondary | ICD-10-CM | POA: Diagnosis not present

## 2021-02-04 DIAGNOSIS — I10 Essential (primary) hypertension: Secondary | ICD-10-CM | POA: Diagnosis not present

## 2021-02-17 ENCOUNTER — Other Ambulatory Visit: Payer: Self-pay | Admitting: *Deleted

## 2021-02-17 DIAGNOSIS — Z87891 Personal history of nicotine dependence: Secondary | ICD-10-CM

## 2021-02-18 DIAGNOSIS — E1165 Type 2 diabetes mellitus with hyperglycemia: Secondary | ICD-10-CM | POA: Diagnosis not present

## 2021-02-18 DIAGNOSIS — I10 Essential (primary) hypertension: Secondary | ICD-10-CM | POA: Diagnosis not present

## 2021-02-18 DIAGNOSIS — E782 Mixed hyperlipidemia: Secondary | ICD-10-CM | POA: Diagnosis not present

## 2021-02-18 DIAGNOSIS — Z122 Encounter for screening for malignant neoplasm of respiratory organs: Secondary | ICD-10-CM | POA: Diagnosis not present

## 2021-02-18 DIAGNOSIS — M85852 Other specified disorders of bone density and structure, left thigh: Secondary | ICD-10-CM | POA: Diagnosis not present

## 2021-02-18 DIAGNOSIS — R718 Other abnormality of red blood cells: Secondary | ICD-10-CM | POA: Diagnosis not present

## 2021-02-18 DIAGNOSIS — Z23 Encounter for immunization: Secondary | ICD-10-CM | POA: Diagnosis not present

## 2021-02-18 DIAGNOSIS — Z7984 Long term (current) use of oral hypoglycemic drugs: Secondary | ICD-10-CM | POA: Diagnosis not present

## 2021-03-29 ENCOUNTER — Ambulatory Visit
Admission: RE | Admit: 2021-03-29 | Discharge: 2021-03-29 | Disposition: A | Discharge status: Home / Self Care | Payer: Medicare Other | Source: Ambulatory Visit | Attending: Acute Care | Admitting: Acute Care

## 2021-03-29 ENCOUNTER — Other Ambulatory Visit: Payer: Self-pay

## 2021-03-29 ENCOUNTER — Ambulatory Visit (INDEPENDENT_AMBULATORY_CARE_PROVIDER_SITE_OTHER): Payer: Medicare Other | Admitting: Acute Care

## 2021-03-29 ENCOUNTER — Encounter: Payer: Self-pay | Admitting: Acute Care

## 2021-03-29 VITALS — BP 118/64 | HR 108 | Temp 97.4°F | Ht 65.5 in | Wt 147.4 lb

## 2021-03-29 DIAGNOSIS — Z87891 Personal history of nicotine dependence: Secondary | ICD-10-CM | POA: Diagnosis not present

## 2021-03-29 NOTE — Patient Instructions (Signed)
Thank you for participating in the [facility]. It was our pleasure to meet you today. We will call you with the results of your scan within the next few days. Your scan will be assigned a Lung RADS category score by the physicians reading the scans.  This Lung RADS score determines follow up scanning.  See below for description of categories, and follow up screening recommendations. We will be in touch to schedule your follow up screening annually or based on recommendations of our providers. We will fax a copy of your scan results to your Primary Care Physician, or the physician who referred you to the program, to ensure they have the results. Please call the office if you have any questions or concerns regarding your scanning experience or results.  Our office number is [phone]. Please speak with Denise Phelps, RN. She is our Lung Cancer Screening RN. If she is unavailable when you call, please have the office staff send her a message. She will return your call at her earliest convenience. Remember, if your scan is normal, we will scan you annually as long as you continue to meet the criteria for the program. (Age 55-77, Current smoker or smoker who has quit within the last 15 years). If you are a smoker, remember, quitting is the single most powerful action that you can take to decrease your risk of lung cancer and other pulmonary, breathing related problems. We know quitting is hard, and we are here to help.  Please let us know if there is anything we can do to help you meet your goal of quitting. If you are a former smoker, congratulations. We are proud of you! Remain smoke free! Remember you can refer friends or family members through the number above.  We will screen them to make sure they meet criteria for the program. Thank you for helping us take better care of you by participating in Lung Screening.  Lung RADS Categories:  Lung RADS 1: no nodules  or definitely non-concerning nodules.  Recommendation is for a repeat annual scan in 12 months.  Lung RADS 2:  nodules that are non-concerning in appearance and behavior with a very low likelihood of becoming an active cancer. Recommendation is for a repeat annual scan in 12 months.  Lung RADS 3: nodules that are probably non-concerning , includes nodules with a low likelihood of becoming an active cancer.  Recommendation is for a 6-month repeat screening scan. Often noted after an upper respiratory illness. We will be in touch to make sure you have no questions, and to schedule your 6-month scan.  Lung RADS 4 A: nodules with concerning findings, recommendation is most often for a follow up scan in 3 months or additional testing based on our provider's assessment of the scan. We will be in touch to make sure you have no questions and to schedule the recommended 3 month follow up scan.  Lung RADS 4 B:  indicates findings that are concerning. We will be in touch with you to schedule additional diagnostic testing based on our provider's  assessment of the scan.   

## 2021-03-29 NOTE — Progress Notes (Signed)
Shared Decision Making Visit Lung Cancer Screening Program [phone])   Eligibility: Age 75 y.o. Pack Years Smoking History Calculation 66 pack year smoking history (# packs/per year x # years smoked) Recent History of coughing up blood  no Unexplained weight loss? no ( >Than 15 pounds within the last 6 months ) Prior History Lung / other cancer no (Diagnosis within the last 5 years already requiring surveillance chest CT Scans). Smoking Status Former Smoker Former Smokers: Years since quit: 4 years  Quit Date: 2018  Visit Components: Discussion included one or more decision making aids. yes Discussion included risk/benefits of screening. yes Discussion included potential follow up diagnostic testing for abnormal scans. yes Discussion included meaning and risk of over diagnosis. yes Discussion included meaning and risk of False Positives. yes Discussion included meaning of total radiation exposure. yes  Counseling Included: Importance of adherence to annual lung cancer LDCT screening. yes Impact of comorbidities on ability to participate in the program. yes Ability and willingness to under diagnostic treatment. yes  Smoking Cessation Counseling: Current Smokers:  Discussed importance of smoking cessation. yes Information about tobacco cessation classes and interventions provided to patient. yes Patient provided with "ticket" for LDCT Scan. yes Symptomatic Patient. no  Counseling Diagnosis Code: Tobacco Use Z72.0 Asymptomatic Patient yes  Counseling (Intermediate counseling: > three minutes counseling) M3846 Former Smokers:  Discussed the importance of maintaining cigarette abstinence. yes Diagnosis Code: Personal History of Nicotine Dependence. K59.935 Information about tobacco cessation classes and interventions provided to patient. Yes Patient provided with "ticket" for LDCT Scan. yes Written Order for Lung Cancer Screening with LDCT placed in Epic. Yes (CT Chest Lung  Cancer Screening Low Dose W/O CM) [id number] Z12.2-Screening of respiratory organs Z87.891-Personal history of nicotine dependence  I spent 25 minutes of face to face time with Ms. Yehle discussing the risks and benefits of lung cancer screening. We viewed a power point together that explained in detail the above noted topics. We took the time to pause the power point at intervals to allow for questions to be asked and answered to ensure understanding. We discussed that she had taken the single most powerful action possible to decrease her risk of developing lung cancer when she quit smoking. I counseled her to remain smoke free, and to contact me if she ever had the desire to smoke again so that I can provide resources and tools to help support the effort to remain smoke free. We discussed the time and location of the scan, and that either  Doroteo Glassman RN or I will call with the results within  24-48 hours of receiving them. She has my card and contact information in the event she needs to speak with me, in addition to a copy of the power point we reviewed as a resource. She verbalized understanding of all of the above and had no further questions upon leaving the office.     I explained to the patient that there has been a high incidence of coronary artery disease noted on these exams. I explained that this is a non-gated exam therefore degree or severity cannot be determined. This patient is not on statin therapy. I have asked the patient to follow-up with their PCP regarding any incidental finding of coronary artery disease and management with diet or medication as they feel is clinically indicated. The patient verbalized understanding of the above and had no further questions.     Magdalen Spatz, NP 03/29/2021

## 2021-04-13 ENCOUNTER — Other Ambulatory Visit: Payer: Self-pay | Admitting: *Deleted

## 2021-04-13 DIAGNOSIS — Z87891 Personal history of nicotine dependence: Secondary | ICD-10-CM

## 2021-04-13 NOTE — Progress Notes (Signed)
Please call patient and let them  know their  low dose Ct was read as a Lung RADS 2: nodules that are benign in appearance and behavior with a very low likelihood of becoming a clinically active cancer due to size or lack of growth. Recommendation per radiology is for a repeat LDCT in 12 months. .Please let them  know we will order and schedule their  annual screening scan for 03/2022. Please let them  know there was notation of CAD on their  scan.  Please remind the patient  that this is a non-gated exam therefore degree or severity of disease  cannot be determined. Please have them  follow up with their PCP regarding potential risk factor modification, dietary therapy or pharmacologic therapy if clinically indicated. Pt.  is  not currently on statin therapy. Please place order for annual  screening scan for  03/2022 and fax results to PCP. Thanks so much.  There is notation of aortic atherosclerosis.No cards notes of echo noted in system.Please have them follow up with PCP. ( They may have cards managed at [facility])

## 2021-08-02 DIAGNOSIS — E1165 Type 2 diabetes mellitus with hyperglycemia: Secondary | ICD-10-CM | POA: Diagnosis not present

## 2021-08-02 DIAGNOSIS — Z7984 Long term (current) use of oral hypoglycemic drugs: Secondary | ICD-10-CM | POA: Diagnosis not present

## 2021-08-11 DIAGNOSIS — M79672 Pain in left foot: Secondary | ICD-10-CM | POA: Diagnosis not present

## 2021-08-11 DIAGNOSIS — S91209A Unspecified open wound of unspecified toe(s) with damage to nail, initial encounter: Secondary | ICD-10-CM | POA: Diagnosis not present

## 2021-08-11 DIAGNOSIS — M79671 Pain in right foot: Secondary | ICD-10-CM | POA: Diagnosis not present

## 2021-08-11 DIAGNOSIS — Z7984 Long term (current) use of oral hypoglycemic drugs: Secondary | ICD-10-CM | POA: Diagnosis not present

## 2021-08-11 DIAGNOSIS — I10 Essential (primary) hypertension: Secondary | ICD-10-CM | POA: Diagnosis not present

## 2021-08-11 DIAGNOSIS — E1165 Type 2 diabetes mellitus with hyperglycemia: Secondary | ICD-10-CM | POA: Diagnosis not present

## 2021-08-11 DIAGNOSIS — R0989 Other specified symptoms and signs involving the circulatory and respiratory systems: Secondary | ICD-10-CM | POA: Diagnosis not present

## 2021-08-11 DIAGNOSIS — Z23 Encounter for immunization: Secondary | ICD-10-CM | POA: Diagnosis not present

## 2021-08-11 DIAGNOSIS — E782 Mixed hyperlipidemia: Secondary | ICD-10-CM | POA: Diagnosis not present

## 2021-08-11 DIAGNOSIS — M85852 Other specified disorders of bone density and structure, left thigh: Secondary | ICD-10-CM | POA: Diagnosis not present

## 2021-08-12 ENCOUNTER — Other Ambulatory Visit (HOSPITAL_COMMUNITY): Payer: Self-pay | Admitting: Family Medicine

## 2021-08-12 DIAGNOSIS — R0989 Other specified symptoms and signs involving the circulatory and respiratory systems: Secondary | ICD-10-CM

## 2021-08-13 ENCOUNTER — Other Ambulatory Visit: Payer: Self-pay

## 2021-08-13 ENCOUNTER — Ambulatory Visit (HOSPITAL_COMMUNITY)
Admission: RE | Admit: 2021-08-13 | Discharge: 2021-08-13 | Disposition: A | Discharge status: Home / Self Care | Payer: Medicare Other | Source: Ambulatory Visit | Attending: Family Medicine | Admitting: Family Medicine

## 2021-08-13 DIAGNOSIS — R0989 Other specified symptoms and signs involving the circulatory and respiratory systems: Secondary | ICD-10-CM | POA: Diagnosis not present

## 2021-08-26 ENCOUNTER — Ambulatory Visit (INDEPENDENT_AMBULATORY_CARE_PROVIDER_SITE_OTHER): Payer: Medicare Other

## 2021-08-26 ENCOUNTER — Other Ambulatory Visit: Payer: Self-pay

## 2021-08-26 ENCOUNTER — Ambulatory Visit: Payer: Self-pay

## 2021-08-26 ENCOUNTER — Ambulatory Visit (INDEPENDENT_AMBULATORY_CARE_PROVIDER_SITE_OTHER): Payer: Medicare Other | Admitting: Orthopedic Surgery

## 2021-08-26 DIAGNOSIS — M79671 Pain in right foot: Secondary | ICD-10-CM

## 2021-08-26 DIAGNOSIS — M79672 Pain in left foot: Secondary | ICD-10-CM

## 2021-08-27 ENCOUNTER — Encounter: Payer: Self-pay | Admitting: Orthopedic Surgery

## 2021-08-27 NOTE — Progress Notes (Signed)
Office Visit Note   Patient: Elizabeth Gill           Date of Birth: 1946-02-04           MRN: [id number] Visit Date: 08/26/2021              Requested by: Cari Caraway, [place],  [place] [address] PCP: Cari Caraway, MD  Chief Complaint  Patient presents with   Right Foot - Pain   Left Foot - Pain      HPI: Patient is a 75 year old woman who was seen complaining of bilateral foot pain for about 6 weeks.  She states the pain comes and goes she states she could not sleep and had to hang her foot off the bed complains of throbbing without tingling or burning.  Patient states she has hit her foot against some weights.  She states when she has pain her toe pain goes up her leg.  Assessment & Plan: Visit Diagnoses:  1. Pain in right foot   2. Pain in left foot     Plan: Patient will continue to increase her activities as tolerated anticipate this should resolve uneventfully.  Follow-Up Instructions: Return if symptoms worsen or fail to improve.   Ortho Exam  Patient is alert, oriented, no adenopathy, well-dressed, normal affect, normal respiratory effort. Examination patient has good pulses bilaterally she has good dorsiflexion of the ankle she has a negative straight leg raise no sciatic tension signs.  There is no pain to palpation of either foot there is no swelling the Lisfranc complex is stable.  She does have a small subungual hematoma beneath the right great toe she will follow this and if it does not move with the nail she will call us.  Imaging: No results found. No images are attached to the encounter.  Labs: No results found for: HGBA1C, ESRSEDRATE, CRP, LABURIC, REPTSTATUS, GRAMSTAIN, CULT, LABORGA   No results found for: ALBUMIN, PREALBUMIN, CBC  No results found for: MG No results found for: VD25OH  No results found for: PREALBUMIN No flowsheet data found.   There is no height or weight on file to calculate BMI.  Orders:   Orders Placed This Encounter  Procedures   XR Foot 2 Views Right   XR Foot 2 Views Left   No orders of the defined types were placed in this encounter.    Procedures: No procedures performed  Clinical Data: No additional findings.  ROS:  All other systems negative, except as noted in the HPI. Review of Systems  Objective: Vital Signs: There were no vitals taken for this visit.  Specialty Comments:  No specialty comments available.  PMFS History: There are no problems to display for this patient.  Past Medical History:  Diagnosis Date   Anemia    Arthritis    back   Cataract    Diabetes mellitus without complication ([place])    Hypertension    Thyroid nodule 1980"s    Family History  Problem Relation Age of Onset   Diabetes Mother    Diabetes Father    Diabetes Sister    Diabetes Brother    Colon polyps Other    Pancreatic cancer Sister    Colon cancer Neg Hx    Esophageal cancer Neg Hx    Stomach cancer Neg Hx    Rectal cancer Neg Hx     Past Surgical History:  Procedure Laterality Date   ABDOMINAL HYSTERECTOMY  bandaid surgery     Social History   Occupational History   Not on file  Tobacco Use   Smoking status: Former   Smokeless tobacco: Never  Vaping Use   Vaping Use: Never used  Substance and Sexual Activity   Alcohol use: Not on file    Comment: occasionally a beer   Drug use: Never   Sexual activity: Not on file

## 2021-09-02 DIAGNOSIS — I10 Essential (primary) hypertension: Secondary | ICD-10-CM | POA: Diagnosis not present

## 2022-02-14 DIAGNOSIS — E1165 Type 2 diabetes mellitus with hyperglycemia: Secondary | ICD-10-CM | POA: Diagnosis not present

## 2022-02-14 DIAGNOSIS — E782 Mixed hyperlipidemia: Secondary | ICD-10-CM | POA: Diagnosis not present

## 2022-02-17 DIAGNOSIS — M85852 Other specified disorders of bone density and structure, left thigh: Secondary | ICD-10-CM | POA: Diagnosis not present

## 2022-02-17 DIAGNOSIS — I10 Essential (primary) hypertension: Secondary | ICD-10-CM | POA: Diagnosis not present

## 2022-02-17 DIAGNOSIS — E782 Mixed hyperlipidemia: Secondary | ICD-10-CM | POA: Diagnosis not present

## 2022-02-17 DIAGNOSIS — E1165 Type 2 diabetes mellitus with hyperglycemia: Secondary | ICD-10-CM | POA: Diagnosis not present

## 2022-03-08 DIAGNOSIS — M8589 Other specified disorders of bone density and structure, multiple sites: Secondary | ICD-10-CM | POA: Diagnosis not present

## 2022-03-08 DIAGNOSIS — Z780 Asymptomatic menopausal state: Secondary | ICD-10-CM | POA: Diagnosis not present

## 2022-03-29 ENCOUNTER — Ambulatory Visit
Admission: RE | Admit: 2022-03-29 | Discharge: 2022-03-29 | Disposition: A | Discharge status: Home / Self Care | Payer: Medicare HMO | Source: Ambulatory Visit | Attending: Family Medicine | Admitting: Family Medicine

## 2022-03-29 DIAGNOSIS — I2510 Atherosclerotic heart disease of native coronary artery without angina pectoris: Secondary | ICD-10-CM | POA: Diagnosis not present

## 2022-03-29 DIAGNOSIS — Z87891 Personal history of nicotine dependence: Secondary | ICD-10-CM | POA: Diagnosis not present

## 2022-03-29 DIAGNOSIS — E041 Nontoxic single thyroid nodule: Secondary | ICD-10-CM | POA: Diagnosis not present

## 2022-03-29 DIAGNOSIS — J432 Centrilobular emphysema: Secondary | ICD-10-CM | POA: Diagnosis not present

## 2022-04-01 ENCOUNTER — Other Ambulatory Visit: Payer: Self-pay

## 2022-04-01 DIAGNOSIS — Z122 Encounter for screening for malignant neoplasm of respiratory organs: Secondary | ICD-10-CM

## 2022-04-01 DIAGNOSIS — Z87891 Personal history of nicotine dependence: Secondary | ICD-10-CM

## 2022-07-01 ENCOUNTER — Encounter: Payer: Self-pay | Admitting: Gastroenterology

## 2022-09-07 DIAGNOSIS — H524 Presbyopia: Secondary | ICD-10-CM | POA: Diagnosis not present

## 2022-09-07 DIAGNOSIS — H52209 Unspecified astigmatism, unspecified eye: Secondary | ICD-10-CM | POA: Diagnosis not present

## 2022-09-07 DIAGNOSIS — H5213 Myopia, bilateral: Secondary | ICD-10-CM | POA: Diagnosis not present

## 2023-02-24 ENCOUNTER — Other Ambulatory Visit: Payer: Self-pay | Admitting: Acute Care

## 2023-02-24 DIAGNOSIS — Z122 Encounter for screening for malignant neoplasm of respiratory organs: Secondary | ICD-10-CM

## 2023-02-24 DIAGNOSIS — Z87891 Personal history of nicotine dependence: Secondary | ICD-10-CM

## 2023-04-04 ENCOUNTER — Ambulatory Visit
Admission: RE | Admit: 2023-04-04 | Discharge: 2023-04-04 | Disposition: A | Discharge status: Home / Self Care | Payer: Medicare HMO | Source: Ambulatory Visit | Attending: Family Medicine | Admitting: Family Medicine

## 2023-04-04 DIAGNOSIS — Z122 Encounter for screening for malignant neoplasm of respiratory organs: Secondary | ICD-10-CM

## 2023-04-04 DIAGNOSIS — Z87891 Personal history of nicotine dependence: Secondary | ICD-10-CM

## 2023-04-12 ENCOUNTER — Other Ambulatory Visit: Payer: Self-pay

## 2023-04-12 DIAGNOSIS — Z122 Encounter for screening for malignant neoplasm of respiratory organs: Secondary | ICD-10-CM

## 2023-04-12 DIAGNOSIS — Z87891 Personal history of nicotine dependence: Secondary | ICD-10-CM

## 2023-06-07 IMAGING — CT CT CHEST LUNG CANCER SCREENING LOW DOSE W/O CM
2 of 5 series · 15 of 40 positions shown, 18 images · non-contrast
Comparison: No priors.

CLINICAL DATA: 74-year-old female former smoker (quit 4 years ago)
with 66 pack-year history of smoking. Lung cancer screening
examination.

EXAM:
CT CHEST WITHOUT CONTRAST LOW-DOSE FOR LUNG CANCER SCREENING
TECHNIQUE: Multidetector CT imaging of the chest was performed following the
standard protocol without IV contrast.

[Series 4: lung 1.00 br44 cor · coronal · 0.62mm/px · 3 of 295 slices shown]
[im 59/295  lung]
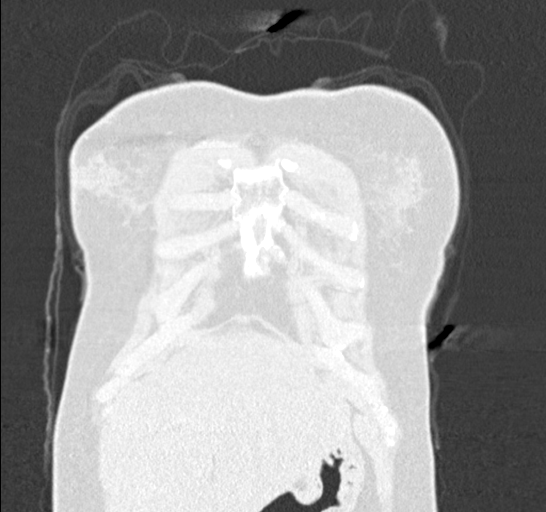
[im 118/295  lung]
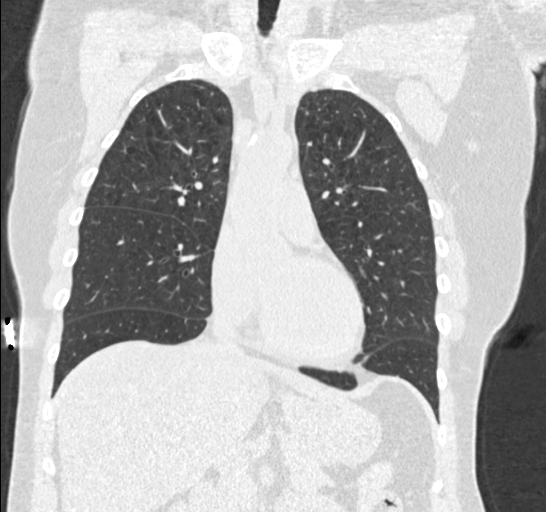
[im 177/295  lung]
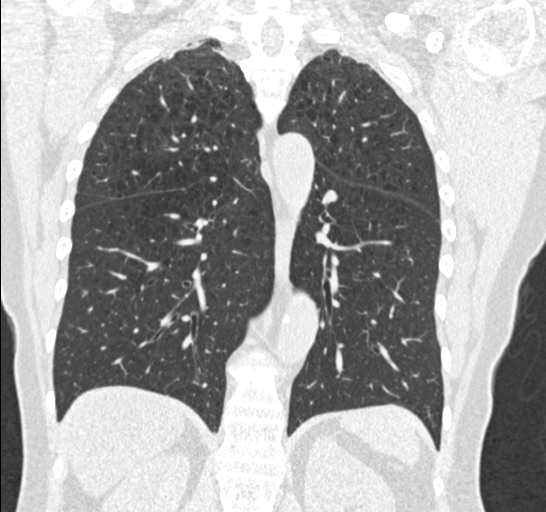

[Series 9: lung 1.00 br60 axial · axial · 0.66mm/px · z∈[-1248,-962]mm · 12 of 318 slices shown, 15 images]
[im 16/318  mediastinal]
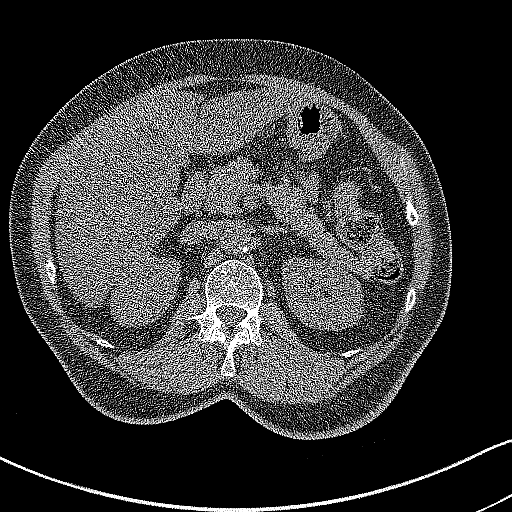
[im 16/318  lung]
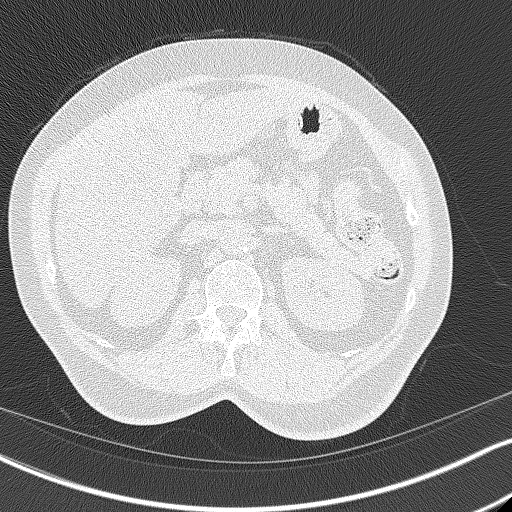
[im 46/318  lung]
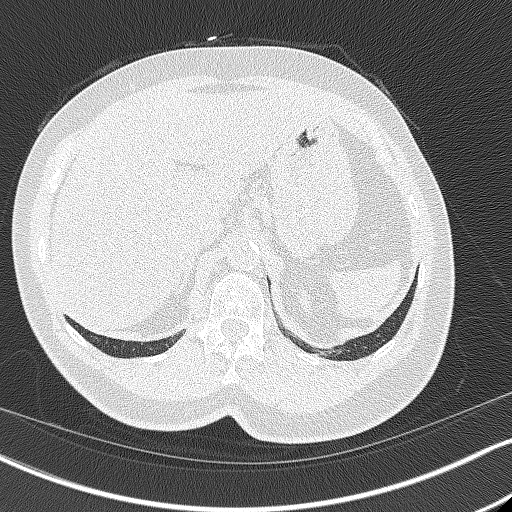
[im 76/318  lung]
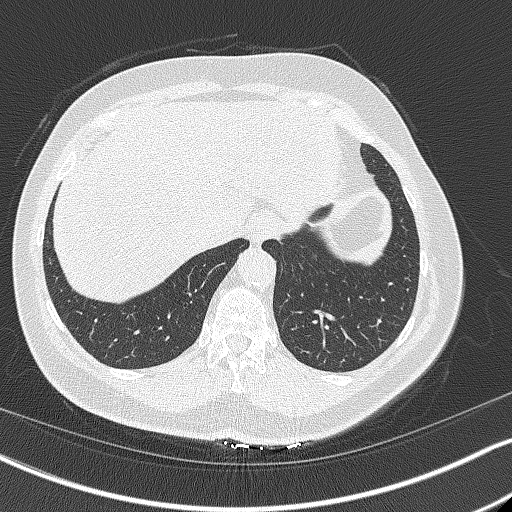
[im 91/318  lung]
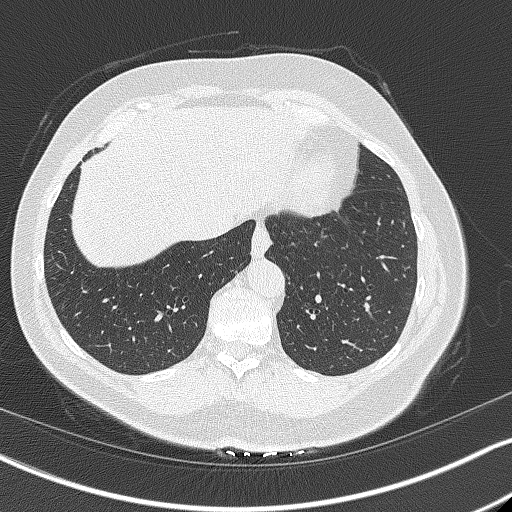
[im 121/318  mediastinal]
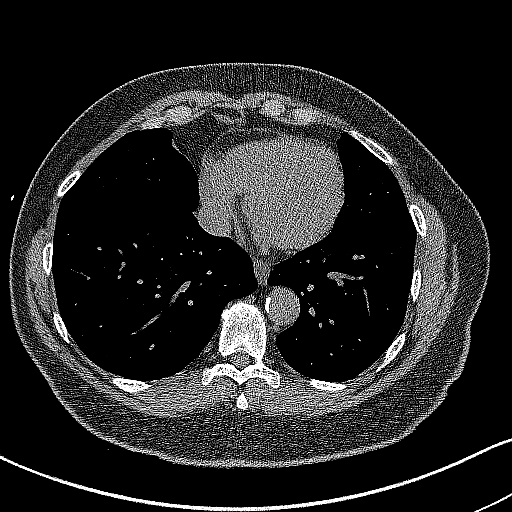
[im 121/318  lung]
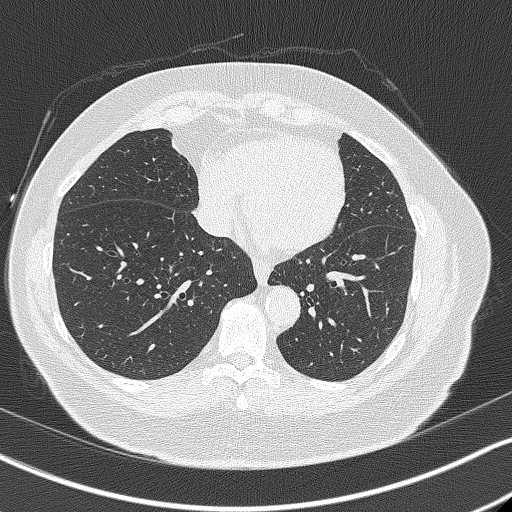
[im 151/318  lung]
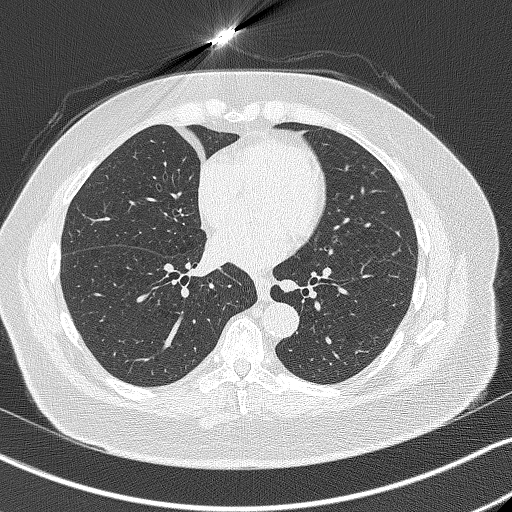
[im 167/318  lung]
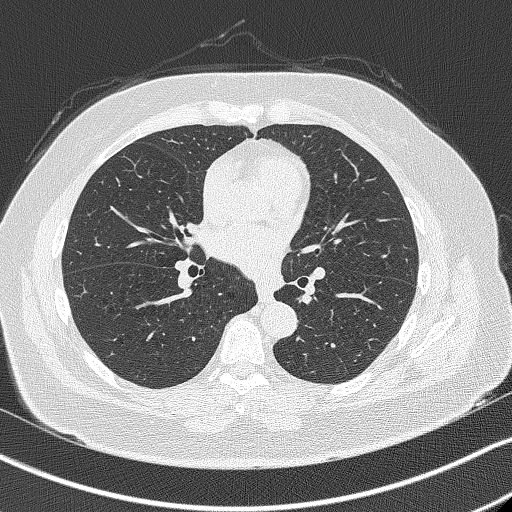
[im 197/318  lung]
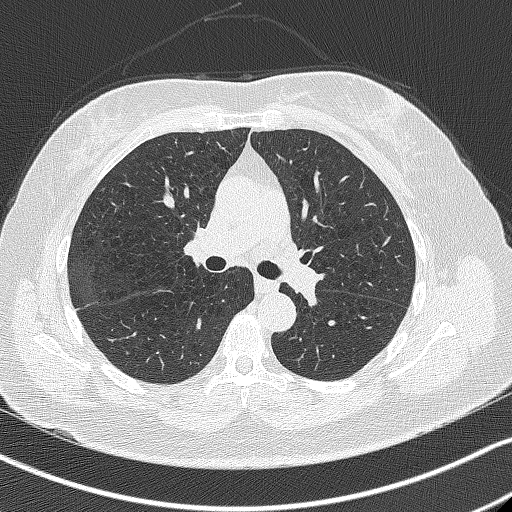
[im 227/318  mediastinal]
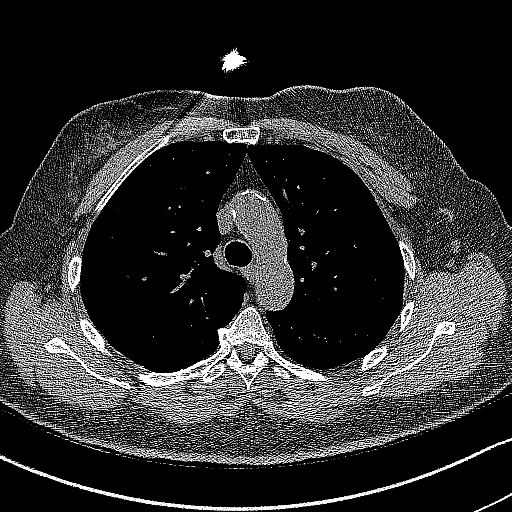
[im 227/318  lung]
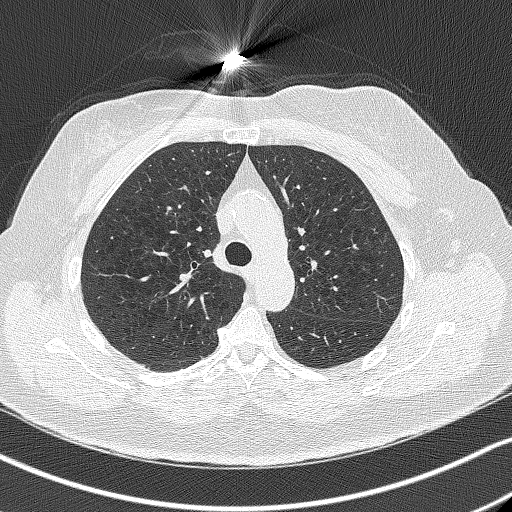
[im 242/318  lung]
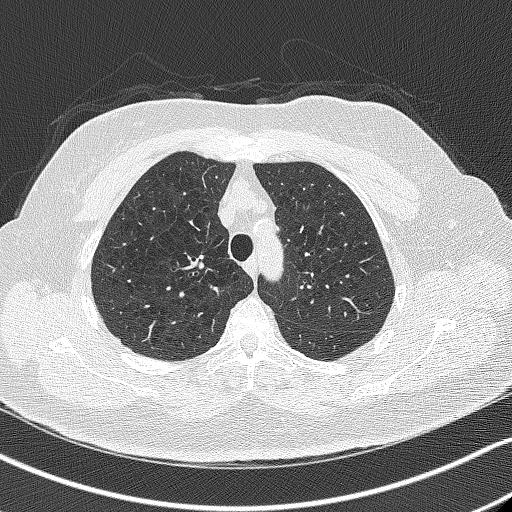
[im 272/318  lung]
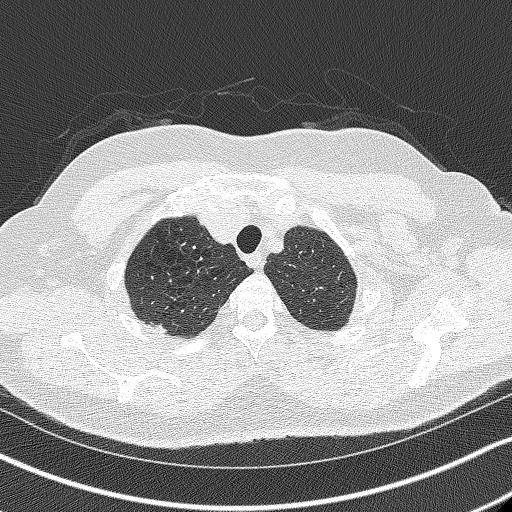
[im 302/318  lung]
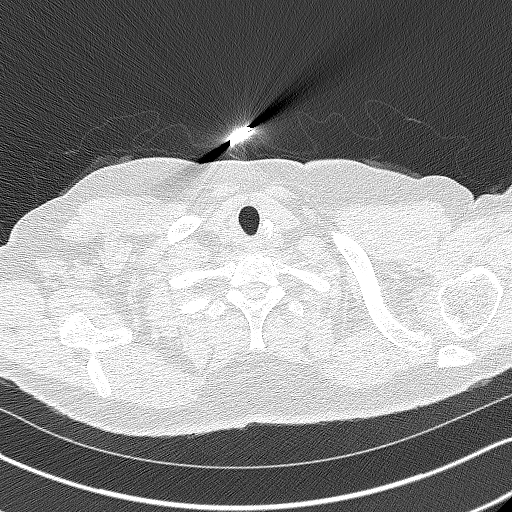

[15 of 40 positions shown; findings below may reference images not displayed]

FINDINGS: Cardiovascular: Heart size is normal. There is no significant
pericardial fluid, thickening or pericardial calcification. There is
aortic atherosclerosis, as well as atherosclerosis of the great
vessels of the mediastinum and the coronary arteries, including
calcified atherosclerotic plaque in the left circumflex coronary
artery.

Mediastinum/Nodes: No pathologically enlarged mediastinal or hilar
lymph nodes. Please note that accurate exclusion of hilar adenopathy
is limited on noncontrast CT scans. Esophagus is unremarkable in
appearance. No axillary lymphadenopathy.

Lungs/Pleura: Multiple small pulmonary nodules are noted throughout
the lungs bilaterally, largest of which is in the periphery of the
right lower lobe (axial image 136 of series 3), with a volume
derived mean diameter of 3.2 mm. No larger more suspicious appearing
pulmonary nodules or masses are noted. No acute consolidative
airspace disease. No pleural effusions. Diffuse bronchial wall
thickening with moderate centrilobular and paraseptal emphysema.

Upper Abdomen: Aortic atherosclerosis. Diffuse low attenuation
throughout the visualized hepatic parenchyma, indicative of hepatic
steatosis.

Musculoskeletal: There are no aggressive appearing lytic or blastic
lesions noted in the visualized portions of the skeleton.
IMPRESSION: 1. Lung-RADS 2S, benign appearance or behavior. Continue annual
screening with low-dose chest CT without contrast in 12 months.
2. The "S" modifier above refers to potentially clinically
significant non lung cancer related findings. Specifically, there is
aortic atherosclerosis, in addition to left circumflex coronary
artery disease. Please note that although the presence of coronary
artery calcium documents the presence of coronary artery disease,
the severity of this disease and any potential stenosis cannot be
assessed on this non-gated CT examination. Assessment for potential
risk factor modification, dietary therapy or pharmacologic therapy
may be warranted, if clinically indicated.
3. Mild diffuse bronchial wall thickening with moderate
centrilobular and paraseptal emphysema; imaging findings suggestive
of underlying COPD.

Aortic Atherosclerosis (AWQ8C-U9S.S) and Emphysema (AWQ8C-PVN.8).

## 2023-08-08 DIAGNOSIS — E782 Mixed hyperlipidemia: Secondary | ICD-10-CM | POA: Diagnosis not present

## 2023-08-08 DIAGNOSIS — M85852 Other specified disorders of bone density and structure, left thigh: Secondary | ICD-10-CM | POA: Diagnosis not present

## 2023-08-08 DIAGNOSIS — E1165 Type 2 diabetes mellitus with hyperglycemia: Secondary | ICD-10-CM | POA: Diagnosis not present

## 2023-08-16 DIAGNOSIS — Z6821 Body mass index (BMI) 21.0-21.9, adult: Secondary | ICD-10-CM | POA: Diagnosis not present

## 2023-08-16 DIAGNOSIS — R0989 Other specified symptoms and signs involving the circulatory and respiratory systems: Secondary | ICD-10-CM | POA: Diagnosis not present

## 2023-08-16 DIAGNOSIS — Z23 Encounter for immunization: Secondary | ICD-10-CM | POA: Diagnosis not present

## 2023-08-16 DIAGNOSIS — E1165 Type 2 diabetes mellitus with hyperglycemia: Secondary | ICD-10-CM | POA: Diagnosis not present

## 2023-08-16 DIAGNOSIS — J432 Centrilobular emphysema: Secondary | ICD-10-CM | POA: Diagnosis not present

## 2023-08-16 DIAGNOSIS — E1151 Type 2 diabetes mellitus with diabetic peripheral angiopathy without gangrene: Secondary | ICD-10-CM | POA: Diagnosis not present

## 2023-08-16 DIAGNOSIS — I10 Essential (primary) hypertension: Secondary | ICD-10-CM | POA: Diagnosis not present

## 2023-08-16 DIAGNOSIS — Z8639 Personal history of other endocrine, nutritional and metabolic disease: Secondary | ICD-10-CM | POA: Diagnosis not present

## 2023-08-16 DIAGNOSIS — E782 Mixed hyperlipidemia: Secondary | ICD-10-CM | POA: Diagnosis not present

## 2023-08-16 DIAGNOSIS — I700 Atherosclerosis of aorta: Secondary | ICD-10-CM | POA: Diagnosis not present

## 2024-04-04 ENCOUNTER — Inpatient Hospital Stay
Admission: RE | Admit: 2024-04-04 | Discharge: 2024-04-04 | Disposition: A | Discharge status: Home / Self Care | Source: Ambulatory Visit | Attending: Acute Care | Admitting: Acute Care

## 2024-04-04 DIAGNOSIS — Z122 Encounter for screening for malignant neoplasm of respiratory organs: Secondary | ICD-10-CM | POA: Diagnosis not present

## 2024-04-04 DIAGNOSIS — Z87891 Personal history of nicotine dependence: Secondary | ICD-10-CM | POA: Diagnosis not present

## 2024-04-18 ENCOUNTER — Other Ambulatory Visit: Payer: Self-pay | Admitting: Acute Care

## 2024-04-18 DIAGNOSIS — Z87891 Personal history of nicotine dependence: Secondary | ICD-10-CM

## 2024-04-18 DIAGNOSIS — Z122 Encounter for screening for malignant neoplasm of respiratory organs: Secondary | ICD-10-CM

## 2024-05-06 DIAGNOSIS — I152 Hypertension secondary to endocrine disorders: Secondary | ICD-10-CM | POA: Diagnosis not present

## 2024-05-06 DIAGNOSIS — D179 Benign lipomatous neoplasm, unspecified: Secondary | ICD-10-CM | POA: Diagnosis not present

## 2024-05-06 DIAGNOSIS — Z1331 Encounter for screening for depression: Secondary | ICD-10-CM | POA: Diagnosis not present

## 2024-05-06 DIAGNOSIS — E1159 Type 2 diabetes mellitus with other circulatory complications: Secondary | ICD-10-CM | POA: Diagnosis not present

## 2024-05-06 DIAGNOSIS — Z7984 Long term (current) use of oral hypoglycemic drugs: Secondary | ICD-10-CM | POA: Diagnosis not present

## 2024-05-06 DIAGNOSIS — Z794 Long term (current) use of insulin: Secondary | ICD-10-CM | POA: Diagnosis not present

## 2024-05-06 DIAGNOSIS — J432 Centrilobular emphysema: Secondary | ICD-10-CM | POA: Diagnosis not present

## 2024-05-06 DIAGNOSIS — Z720 Tobacco use: Secondary | ICD-10-CM | POA: Diagnosis not present
